# Patient Record
Sex: Female | Born: 1995 | Race: Black or African American | Hispanic: No | Marital: Single | Smoking: Never smoker
Health system: Southern US, Community
[De-identification: ages and names within clinical notes are randomized; demographics above are authoritative.]

## PROBLEM LIST (undated history)

## (undated) DIAGNOSIS — F988 Other specified behavioral and emotional disorders with onset usually occurring in childhood and adolescence: Secondary | ICD-10-CM

## (undated) DIAGNOSIS — F71 Moderate intellectual disabilities: Secondary | ICD-10-CM

## (undated) DIAGNOSIS — G2401 Drug induced subacute dyskinesia: Secondary | ICD-10-CM

## (undated) DIAGNOSIS — F259 Schizoaffective disorder, unspecified: Secondary | ICD-10-CM

## (undated) DIAGNOSIS — F99 Mental disorder, not otherwise specified: Secondary | ICD-10-CM

## (undated) DIAGNOSIS — F6381 Intermittent explosive disorder: Secondary | ICD-10-CM

## (undated) DIAGNOSIS — F29 Unspecified psychosis not due to a substance or known physiological condition: Secondary | ICD-10-CM

## (undated) DIAGNOSIS — F909 Attention-deficit hyperactivity disorder, unspecified type: Secondary | ICD-10-CM

## (undated) DIAGNOSIS — F429 Obsessive-compulsive disorder, unspecified: Secondary | ICD-10-CM

## (undated) DIAGNOSIS — F319 Bipolar disorder, unspecified: Secondary | ICD-10-CM

## (undated) HISTORY — DX: Intermittent explosive disorder: F63.81

## (undated) HISTORY — DX: Schizoaffective disorder, unspecified: F25.9

## (undated) HISTORY — DX: Bipolar disorder, unspecified: F31.9

## (undated) HISTORY — DX: Moderate intellectual disabilities: F71

## (undated) HISTORY — DX: Attention-deficit hyperactivity disorder, unspecified type: F90.9

---
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
## 2013-03-07 ENCOUNTER — Encounter (HOSPITAL_COMMUNITY): Payer: Self-pay

## 2013-03-07 ENCOUNTER — Emergency Department (HOSPITAL_COMMUNITY)
Admission: EM | Admit: 2013-03-07 | Discharge: 2013-03-07 | Disposition: A | Discharge status: Home / Self Care | Payer: 59 | Attending: Emergency Medicine | Admitting: Emergency Medicine

## 2013-03-07 DIAGNOSIS — Z8659 Personal history of other mental and behavioral disorders: Secondary | ICD-10-CM | POA: Insufficient documentation

## 2013-03-07 DIAGNOSIS — F919 Conduct disorder, unspecified: Secondary | ICD-10-CM | POA: Insufficient documentation

## 2013-03-07 DIAGNOSIS — Z79899 Other long term (current) drug therapy: Secondary | ICD-10-CM | POA: Insufficient documentation

## 2013-03-07 DIAGNOSIS — F29 Unspecified psychosis not due to a substance or known physiological condition: Secondary | ICD-10-CM | POA: Insufficient documentation

## 2013-03-07 DIAGNOSIS — IMO0002 Reserved for concepts with insufficient information to code with codable children: Secondary | ICD-10-CM | POA: Insufficient documentation

## 2013-03-07 LAB — BASIC METABOLIC PANEL
Calcium: 10 mg/dL (ref 8.4–10.5)
Creatinine, Ser: 0.7 mg/dL (ref 0.50–1.10)
GFR calc Af Amer: >90 mL/min (ref >90)
GFR calc non Af Amer: >90 mL/min (ref >90)
Sodium: 143 mEq/L (ref 135–145)

## 2013-03-07 LAB — CBC WITH DIFFERENTIAL/PLATELET
Basophils Absolute: 0 10*3/uL (ref 0.0–0.1)
Basophils Relative: 0 % (ref 0–1)
Eosinophils Relative: 1 % (ref 0–5)
HCT: 42.3 % (ref 36.0–46.0)
Lymphocytes Relative: 27 % (ref 12–46)
MCHC: 32.4 g/dL (ref 30.0–36.0)
MCV: 94.8 fL (ref 78.0–100.0)
Monocytes Absolute: 0.2 10*3/uL (ref 0.1–1.0)
Platelets: 178 10*3/uL (ref 150–400)
RDW: 12.6 % (ref 11.5–15.5)
WBC: 6.9 10*3/uL (ref 4.0–10.5)

## 2013-03-07 LAB — RAPID URINE DRUG SCREEN, HOSP PERFORMED
Amphetamines: NOT DETECTED
Benzodiazepines: NOT DETECTED
Tetrahydrocannabinol: NOT DETECTED

## 2013-03-07 LAB — ETHANOL: Alcohol, Ethyl (B): <11 mg/dL (ref 0–11)

## 2013-03-07 NOTE — ED Notes (Signed)
Dr Patria Mane on phone with group home
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
## 2013-03-07 NOTE — ED Notes (Signed)
Pt wanded in triage  

## 2013-07-10 ENCOUNTER — Encounter: Payer: Self-pay | Admitting: Obstetrics & Gynecology

## 2013-07-10 ENCOUNTER — Ambulatory Visit (INDEPENDENT_AMBULATORY_CARE_PROVIDER_SITE_OTHER): Payer: Medicaid Other | Admitting: Obstetrics & Gynecology

## 2013-07-10 VITALS — BP 100/60 | Ht 64.0 in | Wt 134.0 lb

## 2013-07-10 DIAGNOSIS — Z01419 Encounter for gynecological examination (general) (routine) without abnormal findings: Secondary | ICD-10-CM

## 2013-07-10 DIAGNOSIS — Z3049 Encounter for surveillance of other contraceptives: Secondary | ICD-10-CM

## 2013-07-10 DIAGNOSIS — F99 Mental disorder, not otherwise specified: Secondary | ICD-10-CM

## 2013-07-10 NOTE — Progress Notes (Signed)
Patient ID: Lindsey Rivers, female   DOB: [birth year], 17 y.o.   MRN: [id number] Subjective:     Lindsey Rivers is a 17 y.o. female here for a routine exam.  Patient's last menstrual period was 07/07/2013. No obstetric history on file. Birth Control Method:  ocp Menstrual Calendar(currently): regular  Current complaints: none.   Current acute medical issues:  Psychiatric disorder, as yet not characterized   Recent Gynecologic History Patient's last menstrual period was 07/07/2013. Last Pap: never,  na Last mammogram: never,    Past Medical History  Diagnosis Date  . ADD (attention deficit disorder)   . Psychosis   . Tardive dyskinesia   . Mental disorder   . OCD (obsessive compulsive disorder)     History reviewed. No pertinent past surgical history.  OB History   Grav Para Term Preterm Abortions TAB SAB Ect Mult Living                  History   Social History  . Marital Status: Single    Spouse Name: N/A    Number of Children: N/A  . Years of Education: N/A   Social History Main Topics  . Smoking status: Never Smoker   . Smokeless tobacco: None  . Alcohol Use: No  . Drug Use: No  . Sexual Activity: No   Other Topics Concern  . None   Social History Narrative  . None    History reviewed. No pertinent family history.   Review of Systems  Review of Systems  Constitutional: Negative for fever, chills, weight loss, malaise/fatigue and diaphoresis.  HENT: Negative for hearing loss, ear pain, nosebleeds, congestion, sore throat, neck pain, tinnitus and ear discharge.   Eyes: Negative for blurred vision, double vision, photophobia, pain, discharge and redness.  Respiratory: Negative for cough, hemoptysis, sputum production, shortness of breath, wheezing and stridor.   Cardiovascular: Negative for chest pain, palpitations, orthopnea, claudication, leg swelling and PND.  Gastrointestinal: negative for abdominal pain. Negative for heartburn, nausea, vomiting,  diarrhea, constipation, blood in stool and melena.  Genitourinary: Negative for dysuria, urgency, frequency, hematuria and flank pain.  Musculoskeletal: Negative for myalgias, back pain, joint pain and falls.  Skin: Negative for itching and rash.  Neurological: Negative for dizziness, tingling, tremors, sensory change, speech change, focal weakness, seizures, loss of consciousness, weakness and headaches.  Endo/Heme/Allergies: Negative for environmental allergies and polydipsia. Does not bruise/bleed easily.  Psychiatric/Behavioral: Negative for depression, suicidal ideas, hallucinations, memory loss and substance abuse. The patient is not nervous/anxious and does not have insomnia.        Objective:    Physical Exam  Vitals reviewed. Constitutional: She is oriented to person, place, and time. She appears well-developed and well-nourished.  HENT:  Head: Normocephalic and atraumatic.        Right Ear: External ear normal.  Left Ear: External ear normal.  Nose: Nose normal.  Mouth/Throat: Oropharynx is clear and moist.  Eyes: Conjunctivae and EOM are normal. Pupils are equal, round, and reactive to light. Right eye exhibits no discharge. Left eye exhibits no discharge. No scleral icterus.  Neck: Normal range of motion. Neck supple. No tracheal deviation present. No thyromegaly present.  Cardiovascular: Normal rate, regular rhythm, normal heart sounds and intact distal pulses.  Exam reveals no gallop and no friction rub.   No murmur heard. Respiratory: Effort normal and breath sounds normal. No respiratory distress. She has no wheezes. She has no rales. She exhibits no tenderness.  GI: Soft.  Bowel sounds are normal. She exhibits no distension and no mass. There is no tenderness. There is no rebound and no guarding.  Genitourinary:  Breasts not done      Vulva is normal without lesions Vagina Cervix  Uterus  Adnexa   Musculoskeletal: Normal range of motion. She exhibits no edema and no  tenderness.  Neurological: She is alert and oriented to person, place, and time. She has normal reflexes. She displays normal reflexes. No cranial nerve deficit. She exhibits normal muscle tone. Coordination normal.  Skin: Skin is warm and dry. No rash noted. No erythema. No pallor.  Psychiatric: She has a normal mood and affect. Her behavior is normal. Judgment and thought content normal.       Assessment:    Healthy female exam.    Plan:    Contraception: OCP (estrogen/progesterone). Follow up in: 1 year.

## 2013-09-18 ENCOUNTER — Ambulatory Visit: Payer: Medicaid Other | Admitting: Occupational Therapy

## 2013-09-25 ENCOUNTER — Ambulatory Visit: Payer: 59 | Admitting: *Deleted

## 2013-09-28 ENCOUNTER — Ambulatory Visit: Payer: 59 | Admitting: *Deleted

## 2013-09-28 ENCOUNTER — Encounter (HOSPITAL_COMMUNITY): Payer: Self-pay | Admitting: Emergency Medicine

## 2013-09-28 ENCOUNTER — Emergency Department (HOSPITAL_COMMUNITY)
Admission: EM | Admit: 2013-09-28 | Discharge: 2013-09-29 | Disposition: A | Discharge status: Home / Self Care | Payer: 59 | Attending: Emergency Medicine | Admitting: Emergency Medicine

## 2013-09-28 ENCOUNTER — Emergency Department (HOSPITAL_COMMUNITY): Payer: 59

## 2013-09-28 DIAGNOSIS — S60219A Contusion of unspecified wrist, initial encounter: Secondary | ICD-10-CM | POA: Insufficient documentation

## 2013-09-28 DIAGNOSIS — IMO0002 Reserved for concepts with insufficient information to code with codable children: Secondary | ICD-10-CM | POA: Insufficient documentation

## 2013-09-28 DIAGNOSIS — F99 Mental disorder, not otherwise specified: Secondary | ICD-10-CM

## 2013-09-28 DIAGNOSIS — Z8669 Personal history of other diseases of the nervous system and sense organs: Secondary | ICD-10-CM | POA: Insufficient documentation

## 2013-09-28 DIAGNOSIS — S0181XA Laceration without foreign body of other part of head, initial encounter: Secondary | ICD-10-CM

## 2013-09-28 DIAGNOSIS — S0180XA Unspecified open wound of other part of head, initial encounter: Secondary | ICD-10-CM | POA: Insufficient documentation

## 2013-09-28 DIAGNOSIS — F911 Conduct disorder, childhood-onset type: Secondary | ICD-10-CM | POA: Insufficient documentation

## 2013-09-28 DIAGNOSIS — R4689 Other symptoms and signs involving appearance and behavior: Secondary | ICD-10-CM

## 2013-09-28 DIAGNOSIS — Z3202 Encounter for pregnancy test, result negative: Secondary | ICD-10-CM | POA: Insufficient documentation

## 2013-09-28 DIAGNOSIS — Z79899 Other long term (current) drug therapy: Secondary | ICD-10-CM | POA: Insufficient documentation

## 2013-09-28 DIAGNOSIS — F988 Other specified behavioral and emotional disorders with onset usually occurring in childhood and adolescence: Secondary | ICD-10-CM | POA: Insufficient documentation

## 2013-09-28 LAB — CBC WITH DIFFERENTIAL/PLATELET
BASOS PCT: 0 % (ref 0–1)
Basophils Absolute: 0 10*3/uL (ref 0.0–0.1)
EOS ABS: 0 10*3/uL (ref 0.0–0.7)
Eosinophils Relative: 0 % (ref 0–5)
HEMATOCRIT: 40.8 % (ref 36.0–46.0)
HEMOGLOBIN: 13.4 g/dL (ref 12.0–15.0)
Lymphocytes Relative: 25 % (ref 12–46)
Lymphs Abs: 2.2 10*3/uL (ref 0.7–4.0)
MCH: 30.6 pg (ref 26.0–34.0)
MCHC: 32.8 g/dL (ref 30.0–36.0)
MCV: 93.2 fL (ref 78.0–100.0)
MONO ABS: 0.4 10*3/uL (ref 0.1–1.0)
MONOS PCT: 5 % (ref 3–12)
Neutro Abs: 6 10*3/uL (ref 1.7–7.7)
Neutrophils Relative %: 70 % (ref 43–77)
Platelets: 216 10*3/uL (ref 150–400)
RBC: 4.38 MIL/uL (ref 3.87–5.11)
RDW: 13 % (ref 11.5–15.5)
WBC: 8.6 10*3/uL (ref 4.0–10.5)

## 2013-09-28 LAB — RAPID URINE DRUG SCREEN, HOSP PERFORMED
Amphetamines: NOT DETECTED
BENZODIAZEPINES: NOT DETECTED
Barbiturates: NOT DETECTED
Cocaine: NOT DETECTED
Opiates: NOT DETECTED
Tetrahydrocannabinol: NOT DETECTED

## 2013-09-28 LAB — URINALYSIS, ROUTINE W REFLEX MICROSCOPIC
Bilirubin Urine: NEGATIVE
Glucose, UA: NEGATIVE mg/dL
Hgb urine dipstick: NEGATIVE
KETONES UR: NEGATIVE mg/dL
LEUKOCYTES UA: NEGATIVE
NITRITE: NEGATIVE
PH: 6 (ref 5.0–8.0)
Protein, ur: NEGATIVE mg/dL
UROBILINOGEN UA: 0.2 mg/dL (ref 0.0–1.0)

## 2013-09-28 LAB — COMPREHENSIVE METABOLIC PANEL
ALBUMIN: 3.7 g/dL (ref 3.5–5.2)
ALT: 10 U/L (ref 0–35)
AST: 17 U/L (ref 0–37)
Alkaline Phosphatase: 45 U/L (ref 39–117)
BILIRUBIN TOTAL: 0.4 mg/dL (ref 0.3–1.2)
BUN: 14 mg/dL (ref 6–23)
CO2: 24 mEq/L (ref 19–32)
CREATININE: 0.7 mg/dL (ref 0.50–1.10)
Calcium: 9.6 mg/dL (ref 8.4–10.5)
Chloride: 104 mEq/L (ref 96–112)
GFR calc non Af Amer: >90 mL/min (ref >90)
Glucose, Bld: 110 mg/dL — ABNORMAL HIGH (ref 70–99)
Potassium: 4 mEq/L (ref 3.7–5.3)
Sodium: 140 mEq/L (ref 137–147)
TOTAL PROTEIN: 7.7 g/dL (ref 6.0–8.3)

## 2013-09-28 LAB — PREGNANCY, URINE: PREG TEST UR: NEGATIVE

## 2013-09-28 LAB — ETHANOL

## 2013-09-28 MED ORDER — ACETAMINOPHEN 325 MG PO TABS: 650.0000 mg | ORAL_TABLET | ORAL | Status: DC | PRN

## 2013-09-28 MED ORDER — ALUM & MAG HYDROXIDE-SIMETH 200-200-20 MG/5ML PO SUSP: 30.0000 mL | ORAL | Status: DC | PRN

## 2013-09-28 MED ORDER — LIDOCAINE-EPINEPHRINE-TETRACAINE (LET) TOPICAL GEL: 3.0000 mL | Freq: Once | TOPICAL | Status: DC

## 2013-09-28 MED ORDER — LORAZEPAM 2 MG/ML IJ SOLN
1.0000 mg | Freq: Once | INTRAMUSCULAR | Status: AC
  Administered 2013-09-28: 18:00:00 via INTRAMUSCULAR

## 2013-09-28 MED ORDER — HALOPERIDOL 2 MG PO TABS
2.0000 mg | ORAL_TABLET | ORAL | Status: AC
  Administered 2013-09-28: 2 mg via ORAL
  Filled 2013-09-28: qty 1

## 2013-09-28 MED ORDER — ONDANSETRON HCL 4 MG PO TABS: 4.0000 mg | ORAL_TABLET | Freq: Three times a day (TID) | ORAL | Status: DC | PRN

## 2013-09-28 MED ORDER — HALOPERIDOL 2 MG PO TABS
ORAL_TABLET | ORAL | Status: AC
  Filled 2013-09-28: qty 1

## 2013-09-28 MED ORDER — HALOPERIDOL 2 MG PO TABS
2.0000 mg | ORAL_TABLET | Freq: Two times a day (BID) | ORAL | Status: DC
  Administered 2013-09-29: 2 mg via ORAL
  Filled 2013-09-28 (×8): qty 1

## 2013-09-28 MED ORDER — QUETIAPINE FUMARATE ER 50 MG PO TB24
150.0000 mg | ORAL_TABLET | ORAL | Status: DC
  Administered 2013-09-28 – 2013-09-29 (×2): 150 mg via ORAL
  Filled 2013-09-28: qty 3

## 2013-09-28 MED ORDER — LIDOCAINE-EPINEPHRINE-TETRACAINE (LET) SOLUTION
3.0000 mL | Freq: Once | NASAL | Status: AC
  Administered 2013-09-28: 3 mL via TOPICAL
  Filled 2013-09-28: qty 3

## 2013-09-28 MED ORDER — IBUPROFEN 400 MG PO TABS
600.0000 mg | ORAL_TABLET | Freq: Three times a day (TID) | ORAL | Status: DC | PRN
Start: 2013-09-28 — End: 2013-09-29

## 2013-09-28 MED ORDER — LEVONORGESTREL-ETHINYL ESTRAD 0.15-30 MG-MCG PO TABS
1.0000 | ORAL_TABLET | Freq: Every day | ORAL | Status: DC
  Administered 2013-09-29: 1 via ORAL

## 2013-09-28 MED ORDER — LORAZEPAM 2 MG/ML IJ SOLN
INTRAMUSCULAR | Status: AC
  Filled 2013-09-28: qty 1

## 2013-09-28 MED ORDER — TRAZODONE HCL 50 MG PO TABS
300.0000 mg | ORAL_TABLET | Freq: Every day | ORAL | Status: DC
  Filled 2013-09-28: qty 6
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
## 2013-09-28 NOTE — ED Notes (Signed)
LET applied to forehead.
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
## 2013-09-28 NOTE — Progress Notes (Signed)
MHT initiated bed placement at the following facilities that have beds available for I/DD:  1)[facility]-faxed referral 2)[facility]-faxed referral 3)[facility]-no answer  Donnella ShamMichelle L Fannye Myer,MHT/NS

## 2013-09-28 NOTE — ED Notes (Signed)
Med Tech at group home  Lindsey Rivers [phone]  Group home leader Esmeralda LinksCharlotte Rivers [phone]  They are leaving now and may be reached on the phone

## 2013-09-28 NOTE — ED Notes (Signed)
[facility] and [facility]P

## 2013-09-28 NOTE — ED Notes (Signed)
Called Lindsey GowerCharlotte to bring glasses and birthcontrol. Pt can not see TV, hope glass will help to give pt an activity.

## 2013-09-28 NOTE — ED Notes (Signed)
Called [facility] Med tech, states pt was tapered off Risperdal and started on  Seroquel first week in FEB.

## 2013-09-28 NOTE — ED Notes (Signed)
Pt is screaming, upset, crying, does not want to stay here. Wants to go back to group home. Officer at the bedside.

## 2013-09-28 NOTE — BH Assessment (Signed)
[facility] Assessment Progress Note  At 15:57 I spoke to EDP Devoria AlbeIva Knapp, MD in anticipation of TTS assessment scheduled for 16:15.  Doylene Canninghomas Shammara Jarrett, MA Triage Specialist 09/28/2013 @ 18:54

## 2013-09-28 NOTE — ED Notes (Signed)
Med tech Carlena Saxmma Crews [phone]
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
## 2013-09-28 NOTE — ED Notes (Signed)
Patient refusing to take medications, stating she doesn't like the way they make her feel.  Resistant to answering questions. Acting as if she is asleep at times.  Informed her that as long as she doesn't act out she doesn't have to get the medications, but if she acts out, I may have to give her a shot.  Patient does not acknowledge this information.
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
## 2013-09-28 NOTE — ED Notes (Signed)
[facility] staff at [facility]. States pt did not want to go because she has a boyfriend a school and she wanted to go to school

## 2013-09-28 NOTE — ED Notes (Signed)
telepsy complete 
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
## 2013-09-28 NOTE — ED Notes (Signed)
Group home wants her commited for readjusting medications.  Pt has been speaking non stop since she got here, she continues to be agitated and handcuffed.
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
## 2013-09-28 NOTE — ED Notes (Signed)
Pt states she took birthcontrol today in home

## 2013-09-29 DIAGNOSIS — F603 Borderline personality disorder: Secondary | ICD-10-CM

## 2013-09-29 DIAGNOSIS — F489 Nonpsychotic mental disorder, unspecified: Secondary | ICD-10-CM

## 2013-09-29 NOTE — ED Notes (Signed)
No communication yet with [facility]. [facility] someone bring [facility] home

## 2013-09-29 NOTE — ED Notes (Signed)
Pt sitting in bed stating she is ready to go home. Pt states we can't keep her caged up in this hospital and take her clothes from her. Pt states if we try to place her anywhere other than [facility] with her friends, she is "gonna pitch a fit".  RN stated to patient that we are following protocol by keeping her inside the hospital and in paper scrubs and that she would have breakfast soon and be updated about placement when information is received. [employer] at bedside.

## 2013-09-29 NOTE — BH Assessment (Signed)
[facility] Dr. Willow OraPeter Ganpat declined Pt due to her acuity.  Harlin RainFord Ellis Ria CommentWarrick Jr, LPC, [facility] Triage Specialist

## 2013-09-29 NOTE — BH Assessment (Signed)
Pt was seen by Nanine MeansJamison Lord NP, Catha NottinghamJamison recommended patient be discharged back to group home and follow up with current provider. Dr. Ottie Glazierusal agreed with Nanine MeansJamison Lord NP recommendation, Dois DavenportSandra RN patient's nurse made aware of recommendation.

## 2013-09-29 NOTE — Discharge Instructions (Signed)
Follow up with your doctor as instructed by behavior healthTissue Adhesive Wound Care Some cuts, wounds, lacerations, and incisions can be repaired by using tissue adhesive. Tissue adhesive is like glue. It holds the skin together, allowing for faster healing. It forms a strong bond on the skin in about 1 minute and reaches its full strength in about 2 or 3 minutes. The adhesive disappears naturally while the wound is healing. It is important to take proper care of your wound at home while it heals.  HOME CARE INSTRUCTIONS   Showers are allowed. Do not soak the area containing the tissue adhesive. Do not take baths, swim, or use hot tubs. Do not use any soaps or ointments on the wound. Certain ointments can weaken the glue.  If a bandage (dressing) has been applied, follow your health care provider's instructions for how often to change the dressing.   Keep the dressing dry if one has been applied.   Do not scratch, pick, or rub the adhesive.   Do not place tape over the adhesive. The adhesive could come off when pulling the tape off.   Protect the wound from further injury until it is healed.   Protect the wound from sun and tanning bed exposure while it is healing and for several weeks after healing.   Only take over-the-counter or prescription medicines as directed by your health care provider.   Keep all follow-up appointments as directed by your health care provider. SEEK IMMEDIATE MEDICAL CARE IF:   Your wound becomes red, swollen, hot, or tender.   You develop a rash after the glue is applied.  You have increasing pain in the wound.   You have a red streak that goes away from the wound.   You have pus coming from the wound.   You have increased bleeding.  You have a fever.  You have shaking chills.   You notice a bad smell coming from the wound.   Your wound or adhesive breaks open.  MAKE SURE YOU:   Understand these instructions.  Will watch your  condition.  Will get help right away if you are not doing well or get worse. Document Released: 01/12/2001 Document Revised: 05/09/2013 Document Reviewed: 02/07/2013 [facility] Patient Information 2014 [employer] [place].

## 2013-09-29 NOTE — Progress Notes (Signed)
1326 Pt declined by Abran CantorFrye d/t being combative, considered exclusionary criteria.   Tomi BambergerMariya Addelynn Batte Disposition MHT

## 2013-09-29 NOTE — ED Notes (Signed)
Nurse called [phone] to follow up. Message left for Kara Meadmma to make sure she got in touch with her administrator so pt could return home

## 2013-09-29 NOTE — ED Notes (Signed)
Pt's birth control pills returned with pt with law enforcement custody and d/c instructions

## 2013-09-29 NOTE — ED Notes (Signed)
Patient has slept majority of night, only waking once to go to BR.  She has been calm and cooperative w/out any periods of aggression.

## 2013-09-29 NOTE — Progress Notes (Addendum)
0921 Placed call to lead staff member at the group home Lindsey Rivers regarding whether or not the facility would be willing to accept pt back.  Stated that she would have to discuss the situation with the administrator Lindsey Mule([name]) and would give her the number to TTS and give us information on whether or not pt is allowed back.  Lindsey Rivers also stated that their facility does not have the staff on the weekends to take pt back today so if pt was allowed back it would be Monday 3.2.15.  1243 Placed follow-up call to Ms. Lindsey LinksCharlotte Rivers [phone] regarding pt and speaking with administrator, did not get an answer but left voice mail.  Lindsey Rivers Disposition MHT

## 2013-09-29 NOTE — ED Notes (Signed)
[facility] called to come bring pt home. Kara MeadEmma, stated the administrator was no there. She would not give phone number to nurse calling. Stated she would get in touch with the administrator and have her call us
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
## 2013-10-19 ENCOUNTER — Ambulatory Visit: Payer: Medicaid Other | Attending: Internal Medicine | Admitting: Occupational Therapy

## 2013-10-19 DIAGNOSIS — F79 Unspecified intellectual disabilities: Secondary | ICD-10-CM | POA: Insufficient documentation

## 2013-11-19 ENCOUNTER — Encounter: Payer: 59 | Admitting: Occupational Therapy

## 2015-02-27 ENCOUNTER — Other Ambulatory Visit (HOSPITAL_COMMUNITY)
Admission: RE | Admit: 2015-02-27 | Discharge: 2015-02-27 | Disposition: A | Discharge status: Home / Self Care | Payer: Medicaid Other | Source: Ambulatory Visit | Attending: Obstetrics and Gynecology | Admitting: Obstetrics and Gynecology

## 2015-02-27 ENCOUNTER — Encounter: Payer: Self-pay | Admitting: Adult Health

## 2015-02-27 ENCOUNTER — Ambulatory Visit (INDEPENDENT_AMBULATORY_CARE_PROVIDER_SITE_OTHER): Payer: Medicaid Other | Admitting: Adult Health

## 2015-02-27 VITALS — BP 120/76 | HR 60 | Ht 63.0 in | Wt 150.0 lb

## 2015-02-27 DIAGNOSIS — Z01419 Encounter for gynecological examination (general) (routine) without abnormal findings: Secondary | ICD-10-CM | POA: Diagnosis not present

## 2015-02-27 DIAGNOSIS — F99 Mental disorder, not otherwise specified: Secondary | ICD-10-CM

## 2015-02-27 DIAGNOSIS — Z7689 Persons encountering health services in other specified circumstances: Secondary | ICD-10-CM

## 2015-02-27 MED ORDER — LEVONORGESTREL-ETHINYL ESTRAD 0.15-30 MG-MCG PO TABS: 1.0000 | ORAL_TABLET | Freq: Every day | ORAL | Status: DC

## 2015-02-27 NOTE — Patient Instructions (Signed)
Physical in  1year Pap in 2 years if normal Take portia daily

## 2015-02-27 NOTE — Progress Notes (Signed)
Patient ID: Lindsey Rivers, female   DOB: [birth year], 19 y.o.   MRN: [id number] History of Present Illness: Lindsey Rivers is a 19 year old black female, who resides at [facility], she is in for her first pap and physical.She has her care giver with her.She is cooperative, and child like. PCP is Dr Marilu Favre.  Current Medications, Allergies, Past Medical History, Past Surgical History, Family History and Social History were reviewed in [employer] record.     Review of Systems: Patient denies any headaches, hearing loss, fatigue, blurred vision, shortness of breath, chest pain, abdominal pain, problems with bowel movements, urination, or intercourse(Not having sex). No joint pain or mood swings.    Physical Exam:BP 120/76 mmHg  Pulse 60  Ht  (1.6 m)  Wt 150 lb (68.04 kg)  BMI 26.58 kg/m2 General:  Well developed, well nourished, no acute distress Skin:  Warm and dry Neck:  Midline trachea, normal thyroid, good ROM, no lymphadenopathy Lungs; Clear to auscultation bilaterally Breast:  No dominant palpable mass, retraction, or nipple discharge Cardiovascular: Regular rate and rhythm Abdomen:  Soft, non tender, no hepatosplenomegaly Pelvic:  External genitalia is normal in appearance, no lesions.  The vagina is normal in appearance.Hymen intact. Urethra has no lesions or masses. The cervix is tiny, pap performed.  Uterus is felt to be normal size, shape, and contour.  No adnexal masses or tenderness noted.Bladder is non tender, no masses felt. Extremities/musculoskeletal:  No swelling or varicosities noted, no clubbing or cyanosis Psych:  No mood changes, alert and cooperative,seems happy   Impression: Well woman gyn exam with pap Period management Psychiatric disturbance    Plan:  Refilled portia x 1 year Physical in 1 year Pap in 2 years if normal

## 2015-03-04 LAB — CYTOLOGY - PAP
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
## 2015-06-19 ENCOUNTER — Emergency Department (HOSPITAL_COMMUNITY)
Admission: EM | Admit: 2015-06-19 | Discharge: 2015-06-19 | Disposition: A | Discharge status: Home / Self Care | Payer: MEDICAID | Attending: Emergency Medicine | Admitting: Emergency Medicine

## 2015-06-19 ENCOUNTER — Emergency Department (HOSPITAL_COMMUNITY): Payer: MEDICAID

## 2015-06-19 ENCOUNTER — Encounter (HOSPITAL_COMMUNITY): Payer: Self-pay | Admitting: Emergency Medicine

## 2015-06-19 DIAGNOSIS — S0012XA Contusion of left eyelid and periocular area, initial encounter: Secondary | ICD-10-CM | POA: Insufficient documentation

## 2015-06-19 DIAGNOSIS — Y9289 Other specified places as the place of occurrence of the external cause: Secondary | ICD-10-CM | POA: Insufficient documentation

## 2015-06-19 DIAGNOSIS — Z008 Encounter for other general examination: Secondary | ICD-10-CM | POA: Diagnosis present

## 2015-06-19 DIAGNOSIS — S99912A Unspecified injury of left ankle, initial encounter: Secondary | ICD-10-CM | POA: Diagnosis not present

## 2015-06-19 DIAGNOSIS — Z79899 Other long term (current) drug therapy: Secondary | ICD-10-CM | POA: Diagnosis not present

## 2015-06-19 DIAGNOSIS — Z8781 Personal history of (healed) traumatic fracture: Secondary | ICD-10-CM | POA: Diagnosis not present

## 2015-06-19 DIAGNOSIS — Z7951 Long term (current) use of inhaled steroids: Secondary | ICD-10-CM | POA: Insufficient documentation

## 2015-06-19 DIAGNOSIS — Z793 Long term (current) use of hormonal contraceptives: Secondary | ICD-10-CM | POA: Diagnosis not present

## 2015-06-19 DIAGNOSIS — Z792 Long term (current) use of antibiotics: Secondary | ICD-10-CM | POA: Diagnosis not present

## 2015-06-19 DIAGNOSIS — Y998 Other external cause status: Secondary | ICD-10-CM | POA: Diagnosis not present

## 2015-06-19 DIAGNOSIS — F911 Conduct disorder, childhood-onset type: Secondary | ICD-10-CM | POA: Diagnosis not present

## 2015-06-19 DIAGNOSIS — Y9389 Activity, other specified: Secondary | ICD-10-CM | POA: Diagnosis not present

## 2015-06-19 DIAGNOSIS — Z7952 Long term (current) use of systemic steroids: Secondary | ICD-10-CM | POA: Diagnosis not present

## 2015-06-19 DIAGNOSIS — R4689 Other symptoms and signs involving appearance and behavior: Secondary | ICD-10-CM

## 2015-06-19 MED ORDER — CLONIDINE HCL 0.1 MG PO TABS: 0.1000 mg | ORAL_TABLET | Freq: Two times a day (BID) | ORAL | Status: DC

## 2015-06-19 MED ORDER — CLINDAMYCIN PHOS-BENZOYL PEROX 1-5 % EX GEL: 1.0000 "application " | Freq: Two times a day (BID) | CUTANEOUS | Status: DC

## 2015-06-19 MED ORDER — BREXPIPRAZOLE 0.5 MG PO TABS: 2.0000 | ORAL_TABLET | Freq: Every day | ORAL | Status: DC

## 2015-06-19 MED ORDER — DIVALPROEX SODIUM 250 MG PO DR TAB: 250.0000 mg | DELAYED_RELEASE_TABLET | Freq: Two times a day (BID) | ORAL | Status: DC

## 2015-06-19 MED ORDER — TRAZODONE HCL 50 MG PO TABS: 300.0000 mg | ORAL_TABLET | Freq: Every day | ORAL | Status: DC

## 2015-06-19 MED ORDER — ACETAMINOPHEN 325 MG PO TABS
650.0000 mg | ORAL_TABLET | Freq: Once | ORAL | Status: AC
  Administered 2015-06-19: 650 mg via ORAL

## 2015-06-19 MED ORDER — ACETAMINOPHEN 325 MG PO TABS
ORAL_TABLET | ORAL | Status: AC
  Administered 2015-06-19: 650 mg via ORAL
  Filled 2015-06-19: qty 2

## 2015-06-19 MED ORDER — LEVONORGESTREL-ETHINYL ESTRAD 0.15-30 MG-MCG PO TABS: 1.0000 | ORAL_TABLET | Freq: Every day | ORAL | Status: DC

## 2015-06-19 MED ORDER — FLUTICASONE PROPIONATE 50 MCG/ACT NA SUSP
2.0000 | Freq: Every day | NASAL | Status: DC
Start: 2015-06-20 — End: 2015-06-19
  Filled 2015-06-19: qty 16

## 2015-06-19 MED ORDER — DIAZEPAM 2 MG PO TABS: 2.0000 mg | ORAL_TABLET | Freq: Three times a day (TID) | ORAL | Status: DC
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
## 2015-06-19 NOTE — Discharge Instructions (Signed)
Follow up with out pt tx °

## 2015-06-19 NOTE — ED Notes (Signed)
telepsych in progress 
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
## 2015-06-19 NOTE — ED Notes (Signed)
Attempted to leave message at Group Home but mailbox full.

## 2015-06-19 NOTE — ED Notes (Signed)
Meal tray given 

## 2015-06-19 NOTE — ED Notes (Signed)
[facility]. Patient to be DC'd back to group home.

## 2015-06-19 NOTE — BH Assessment (Addendum)
Tele Assessment Note   Lindsey Rivers is an 19 y.o. female. Pt denies SI/HI. Pt denies AVH. It was reported that the Pt was aggressive at her group and banging her head against the wall. The group home staff contacted the Pt's psychologist and he recommended that the Pt be transported to [facility]. Pt states she is angry because she desires to live with her mother. Pt states "I hate my group home." Pt states she has a guardian but could not provided a name and number of her guardian. Pt denies SA. Pt denies abuse. Pt reports multiple group home placements. Pt states that the group home staff are mean to her because they will not allow her to live with her mother. Pt states that she is currently in a group home because of her behavior. Pt has a psychologist but could not provide their information. Pt is prescribed medication but could not provide her medications.  Writer consulted with May, NP. Per May, NP Pt does not meet inpatient criteria. Pt should be observed and D/C back to group home.   Writer left a message at the client's group.   Diagnosis:  F90.0 ADHD Developmental Delay  Past Medical History:  Past Medical History  Diagnosis Date  . ADD (attention deficit disorder)   . Psychosis   . Tardive dyskinesia   . Mental disorder   . OCD (obsessive compulsive disorder)     History reviewed. No pertinent past surgical history.  Family History: No family history on file.  Social History:  reports that she has never smoked. She has never used smokeless tobacco. She reports that she does not drink alcohol or use illicit drugs.  Additional Social History:     CIWA: CIWA-Ar BP: 121/88 mmHg Pulse Rate: 88 COWS:    PATIENT STRENGTHS: (choose at least two) Communication skills Supportive family/friends  Allergies:  Allergies  Allergen Reactions  . Other     Seasonal allergies    Home Medications:  (Not in a hospital admission)  OB/GYN Status:  No LMP recorded. Patient is not  currently having periods (Reason: Oral contraceptives).  General Assessment Data Location of Assessment: [facility] TTS Assessment: In system Is this a Tele or Face-to-Face Assessment?: Face-to-Face Is this an Initial Assessment or a Re-assessment for this encounter?: Initial Assessment Marital status: Single Maiden name: NA Is patient pregnant?: No Pregnancy Status: No Living Arrangements: Group Home Can pt return to current living arrangement?: Yes Admission Status: Voluntary Is patient capable of signing voluntary admission?: Yes Referral Source: Self/Family/Friend Insurance type: Medicaid     Crisis Care Plan Living Arrangements: Group Home Name of Psychiatrist: Unknown Name of Therapist: Unknown  Education Status Is patient currently in school?: No Current Grade: NA Highest grade of school patient has completed: Unknown Name of school: NA Contact person: NA  Risk to self with the past 6 months Suicidal Ideation: No Has patient been a risk to self within the past 6 months prior to admission? : No Suicidal Intent: No Has patient had any suicidal intent within the past 6 months prior to admission? : No Is patient at risk for suicide?: No Suicidal Plan?: No Has patient had any suicidal plan within the past 6 months prior to admission? : No Access to Means: No What has been your use of drugs/alcohol within the last 12 months?: NA Previous Attempts/Gestures: No How many times?: 0 Other Self Harm Risks: NA Triggers for Past Attempts: None known Intentional Self Injurious Behavior: None Family Suicide  History: Unknown Recent stressful life event(s): Other (Comment) (does not like her group home) Persecutory voices/beliefs?: No Depression: Yes Depression Symptoms: Loss of interest in usual pleasures, Feeling angry/irritable, Feeling worthless/self pity Substance abuse history and/or treatment for substance abuse?: No Suicide prevention information given to non-admitted  patients: Not applicable  Risk to Others within the past 6 months Homicidal Ideation: No Does patient have any lifetime risk of violence toward others beyond the six months prior to admission? : No Thoughts of Harm to Others: No Current Homicidal Intent: No Current Homicidal Plan: No Access to Homicidal Means: No Identified Victim: NA History of harm to others?: No Assessment of Violence: None Noted Violent Behavior Description: NA Does patient have access to weapons?: No Criminal Charges Pending?: No Does patient have a court date: No Is patient on probation?: No  Psychosis Hallucinations: None noted Delusions: None noted  Mental Status Report Appearance/Hygiene: Unremarkable Eye Contact: Fair Motor Activity: Freedom of movement Speech: Tangential Level of Consciousness: Alert Mood: Anxious Affect: Anxious Anxiety Level: Moderate Thought Processes: Tangential Judgement: Impaired Orientation: Place, Situation Obsessive Compulsive Thoughts/Behaviors: None  Cognitive Functioning Concentration: Normal Memory: Recent Intact, Remote Intact IQ: Below Average Level of Function: unknown Insight: Poor Impulse Control: Poor Appetite: Fair Weight Loss: 0 Weight Gain: 0 Sleep: Decreased Total Hours of Sleep: 5 Vegetative Symptoms: None  ADLScreening [facility] Assessment Services) Patient's cognitive ability adequate to safely complete daily activities?: No Patient able to express need for assistance with ADLs?: Yes Independently performs ADLs?: Yes (appropriate for developmental age)  Prior Inpatient Therapy Prior Inpatient Therapy: No Prior Therapy Dates: NA Prior Therapy Facilty/Provider(s): NA Reason for Treatment: NA  Prior Outpatient Therapy Prior Outpatient Therapy: Yes Prior Therapy Dates: 2016 Prior Therapy Facilty/Provider(s): Unknown Reason for Treatment: Unknown Does patient have an ACCT team?: No Does patient have Intensive In-House Services?  : No Does  patient have [facility] services? : No Does patient have P4CC services?: Yes  ADL Screening (condition at time of admission) Patient's cognitive ability adequate to safely complete daily activities?: No Patient able to express need for assistance with ADLs?: Yes Independently performs ADLs?: Yes (appropriate for developmental age)             Advance Directives (For Healthcare) Does patient have an advance directive?: No    Additional Information 1:1 In Past 12 Months?: No CIRT Risk: No Elopement Risk: No Does patient have medical clearance?: Yes     Disposition:  Disposition Initial Assessment Completed for this Encounter: Yes  Caycee Wanat D 06/19/2015 1:30 PM

## 2015-06-19 NOTE — ED Notes (Signed)
Patient belongings secured in ED locker. 1 pair of tennis shoes, 1 pair of jeans, socks, t-shirt, and cotton jacket. Patient advised of belongings policy.

## 2015-06-19 NOTE — Progress Notes (Addendum)
Per NP May Augustin, patient doesn't meet inpatient criteria and to be d/c to group home.  This Clinical research associatewriter attempted to locate patient's legal guardian (pt's grandmother).  Writer spoke with RN Tiffany and left voicemail for med tech Carlena Saxmma Crews [phone], inquiring about contact information for pt's legal guardian but not successful.  Writer informed Ma RingsHarold Carelock [phone]) at Group Home that patient doesn't meet inpatient criteria, that patient is stable, and that pt is now ready for discharge. Jake SharkHarold stated that he will come to the ED to complete their own assessment to make sure that patient is stable enough to return to group home. Jake SharkHarold requested that [facility] RN provides him with a list of the medications that have been administered to patient while in the ED. RN Arlys JohnBrian advised of Harold's request and time of visit - 6pm on 11/17 to complete assessment.  Med Tech at group home    Carlena Saxmma Crews [phone]    Group home leader  Esmeralda LinksCharlotte Garrison  [phone]  CSW will continue to follow up with patient's d/c.  Mel[place] Abtsatia Antaeus Karel, LCSWA Disposition staff 06/19/2015 5:26 PM

## 2015-06-19 NOTE — ED Notes (Addendum)
Ma RingsHarold Rivers [phone], representative from [facility]. [facility] given that information.

## 2015-11-18 ENCOUNTER — Emergency Department (HOSPITAL_COMMUNITY)
Admission: EM | Admit: 2015-11-18 | Discharge: 2015-11-18 | Disposition: A | Discharge status: Home / Self Care | Payer: Medicaid Other | Attending: Emergency Medicine | Admitting: Emergency Medicine

## 2015-11-18 ENCOUNTER — Emergency Department (HOSPITAL_COMMUNITY): Payer: Medicaid Other

## 2015-11-18 ENCOUNTER — Encounter (HOSPITAL_COMMUNITY): Payer: Self-pay | Admitting: *Deleted

## 2015-11-18 DIAGNOSIS — H9201 Otalgia, right ear: Secondary | ICD-10-CM | POA: Insufficient documentation

## 2015-11-18 DIAGNOSIS — J189 Pneumonia, unspecified organism: Secondary | ICD-10-CM | POA: Diagnosis not present

## 2015-11-18 DIAGNOSIS — Z79899 Other long term (current) drug therapy: Secondary | ICD-10-CM | POA: Insufficient documentation

## 2015-11-18 DIAGNOSIS — R05 Cough: Secondary | ICD-10-CM | POA: Diagnosis present

## 2015-11-18 MED ORDER — DOXYCYCLINE HYCLATE 100 MG PO CAPS: 100.0000 mg | ORAL_CAPSULE | Freq: Two times a day (BID) | ORAL | Status: DC

## 2015-11-18 MED ORDER — BENZONATATE 100 MG PO CAPS
200.0000 mg | ORAL_CAPSULE | Freq: Once | ORAL | Status: AC
  Administered 2015-11-18: 200 mg via ORAL
  Filled 2015-11-18: qty 2

## 2015-11-18 MED ORDER — AZITHROMYCIN 250 MG PO TABS
500.0000 mg | ORAL_TABLET | Freq: Once | ORAL | Status: AC
  Administered 2015-11-18: 500 mg via ORAL
  Filled 2015-11-18: qty 2

## 2015-11-18 MED ORDER — ALBUTEROL SULFATE HFA 108 (90 BASE) MCG/ACT IN AERS: 1.0000 | INHALATION_SPRAY | Freq: Four times a day (QID) | RESPIRATORY_TRACT | Status: AC | PRN

## 2015-11-18 MED ORDER — BENZONATATE 100 MG PO CAPS: 200.0000 mg | ORAL_CAPSULE | Freq: Three times a day (TID) | ORAL | Status: AC | PRN

## 2015-11-18 NOTE — Discharge Instructions (Signed)
Community-Acquired Pneumonia, Adult °Pneumonia is an infection of the lungs. There are different types of pneumonia. One type can develop while a person is in a hospital. A different type, called community-acquired pneumonia, develops in people who are not, or have not recently been, in the hospital or other health care facility.  °CAUSES °Pneumonia may be caused by bacteria, viruses, or funguses. Community-acquired pneumonia is often caused by Streptococcus pneumonia bacteria. These bacteria are often passed from one person to another by breathing in droplets from the cough or sneeze of an infected person. °RISK FACTORS °The condition is more likely to develop in: °· People who have chronic diseases, such as chronic obstructive pulmonary disease (COPD), asthma, congestive heart failure, cystic fibrosis, diabetes, or kidney disease. °· People who have early-stage or late-stage HIV. °· People who have sickle cell disease. °· People who have had their spleen removed (splenectomy). °· People who have poor dental hygiene. °· People who have medical conditions that increase the risk of breathing in (aspirating) secretions their own mouth and nose.   °· People who have a weakened immune system (immunocompromised). °· People who smoke. °· People who travel to areas where pneumonia-causing germs commonly exist. °· People who are around animal habitats or animals that have pneumonia-causing germs, including birds, bats, rabbits, cats, and farm animals. °SYMPTOMS °Symptoms of this condition include: °· A dry cough. °· A wet (productive) cough. °· Fever. °· Sweating. °· Chest pain, especially when breathing deeply or coughing. °· Rapid breathing or difficulty breathing. °· Shortness of breath. °· Shaking chills. °· Fatigue. °· Muscle aches. °DIAGNOSIS °Your health care provider will take a medical history and perform a physical exam. You may also have other tests, including: °· Imaging studies of your chest, including  X-rays. °· Tests to check your blood oxygen level and other blood gases. °· Other tests on blood, mucus (sputum), fluid around your lungs (pleural fluid), and urine. °If your pneumonia is severe, other tests may be done to identify the specific cause of your illness. °TREATMENT °The type of treatment that you receive depends on many factors, such as the cause of your pneumonia, the medicines you take, and other medical conditions that you have. For most adults, treatment and recovery from pneumonia may occur at home. In some cases, treatment must happen in a hospital. Treatment may include: °· Antibiotic medicines, if the pneumonia was caused by bacteria. °· Antiviral medicines, if the pneumonia was caused by a virus. °· Medicines that are given by mouth or through an IV tube. °· Oxygen. °· Respiratory therapy. °Although rare, treating severe pneumonia may include: °· Mechanical ventilation. This is done if you are not breathing well on your own and you cannot maintain a safe blood oxygen level. °· Thoracentesis. This procedure removes fluid around one lung or both lungs to help you breathe better. °HOME CARE INSTRUCTIONS °· Take over-the-counter and prescription medicines only as told by your health care provider. °¨ Only take cough medicine if you are losing sleep. Understand that cough medicine can prevent your body's natural ability to remove mucus from your lungs. °¨ If you were prescribed an antibiotic medicine, take it as told by your health care provider. Do not stop taking the antibiotic even if you start to feel better. °· Sleep in a semi-upright position at night. Try sleeping in a reclining chair, or place a few pillows under your head. °· Do not use tobacco products, including cigarettes, chewing tobacco, and e-cigarettes. If you need help quitting, ask your health care provider. °· Drink enough water to keep your urine   clear or pale yellow. This will help to thin out mucus secretions in your  lungs. PREVENTION There are ways that you can decrease your risk of developing community-acquired pneumonia. Consider getting a pneumococcal vaccine if:  You are older than 22 years of age.  You are older than 22 years of age and are undergoing cancer treatment, have chronic lung disease, or have other medical conditions that affect your immune system. Ask your health care provider if this applies to you. There are different types and schedules of pneumococcal vaccines. Ask your health care provider which vaccination option is best for you. You may also prevent community-acquired pneumonia if you take these actions:  Get an influenza vaccine every year. Ask your health care provider which type of influenza vaccine is best for you.  Go to the dentist on a regular basis.  Wash your hands often. Use hand sanitizer if soap and water are not available. SEEK MEDICAL CARE IF:  You have a fever.  You are losing sleep because you cannot control your cough with cough medicine. SEEK IMMEDIATE MEDICAL CARE IF:  You have worsening shortness of breath.  You have increased chest pain.  Your sickness becomes worse, especially if you are an older adult or have a weakened immune system.  You cough up blood.   This information is not intended to replace advice given to you by your health care provider. Make sure you discuss any questions you have with your health care provider.   Document Released: 07/19/2005 Document Revised: 04/09/2015 Document Reviewed: 11/13/2014 Elsevier Interactive Patient Education 2016 [employer]      Use the inhaler as prescribed along with Tessalon which should help you with your cough symptoms.  Rest and make sure you're drinking plenty of fluids.

## 2015-11-18 NOTE — ED Notes (Addendum)
Pt c/o cough. Recently seen at urgent care, given allergy meds. Reports right ear pain also. Patient from [facility].

## 2015-11-19 NOTE — ED Provider Notes (Signed)
CSN: [id number]     Arrival date & time 11/18/15  1807 History   First MD Initiated Contact with Patient 11/18/15 1820     Chief Complaint  Patient presents with  . Cough     (Consider location/radiation/quality/duration/timing/severity/associated sxs/prior Treatment) The history is provided by the patient and a caregiver.   Lindsey Rivers is a 20 y.o. female with medical history outlined below and who lives in a group home,  presents with a one history of uri type symptoms which includes nasal congestion with clear rhinorrhea, sore throat, low grade fever and nonproductive dry sounding cough.  Symptoms do not include shortness of breath, chest pain,  Nausea, vomiting or diarrhea.  She was seen an an urgent care center 2 days ago and placed on claritin which has not improved her sx, in fact she reports feeling worse.  She has developed right ear pain without drainage today. She does endorse muffled hearing on the right.     Past Medical History  Diagnosis Date  . ADD (attention deficit disorder)   . Psychosis   . Tardive dyskinesia   . Mental disorder   . OCD (obsessive compulsive disorder)    History reviewed. No pertinent past surgical history. History reviewed. No pertinent family history. Social History  Substance Use Topics  . Smoking status: Never Smoker   . Smokeless tobacco: Never Used  . Alcohol Use: No   OB History    Gravida Para Term Preterm AB TAB SAB Ectopic Multiple Living       Review of Systems  Constitutional: Negative for fever and chills.  HENT: Positive for congestion, ear pain, rhinorrhea and sore throat. Negative for sinus pressure, trouble swallowing and voice change.   Eyes: Negative for discharge.  Respiratory: Positive for cough. Negative for shortness of breath, wheezing and stridor.   Cardiovascular: Negative for chest pain.  Gastrointestinal: Negative for abdominal pain.  Genitourinary: Negative.       Allergies   Other  Home Medications   Prior to Admission medications   Medication Sig Start Date End Date Taking? Authorizing Provider  Brexpiprazole (REXULTI) 2 MG TABS Take 2 mg by mouth daily.   Yes Historical Provider, MD  clindamycin-benzoyl peroxide (BENZACLIN) gel Apply 1 application topically 2 (two) times daily.   Yes Historical Provider, MD  cloNIDine (CATAPRES) 0.1 MG tablet Take 0.1 mg by mouth 2 (two) times daily.   Yes Historical Provider, MD  diazepam (VALIUM) 2 MG tablet Take 2 mg by mouth 3 (three) times daily.   Yes Historical Provider, MD  divalproex (DEPAKOTE) 250 MG DR tablet Take 250 mg by mouth 2 (two) times daily.   Yes Historical Provider, MD  fluticasone (FLONASE) 50 MCG/ACT nasal spray Place 2 sprays into the nose daily.   Yes Historical Provider, MD  levonorgestrel-ethinyl estradiol (NORDETTE) 0.15-30 MG-MCG tablet Take 1 tablet by mouth daily. 02/27/15  Yes Adline Potter, NP  paliperidone (INVEGA SUSTENNA) 234 MG/1.5ML SUSP injection Inject 234 mg into the muscle every 21 ( twenty-one) days.   Yes Historical Provider, MD  traZODone (DESYREL) 100 MG tablet Take 300 mg by mouth at bedtime.   Yes Historical Provider, MD  albuterol (PROVENTIL HFA;VENTOLIN HFA) 108 (90 Base) MCG/ACT inhaler Inhale 1-2 puffs into the lungs every 6 (six) hours as needed for wheezing or shortness of breath. 11/18/15   Burgess Amor, PA-C  benzonatate (TESSALON) 100 MG capsule Take 2 capsules (200 mg  total) by mouth 3 (three) times daily as needed. 11/18/15   Burgess AmorJulie Daveena Elmore, PA-C  doxycycline (VIBRAMYCIN) 100 MG capsule Take 1 capsule (100 mg total) by mouth 2 (two) times daily. 11/18/15   Burgess AmorJulie Iann Rodier, PA-C   Temp(Src) 97.6 F (36.4 C) (Oral)  LMP 10/27/2015 Physical Exam  Constitutional: She is oriented to person, place, and time. She appears well-developed and well-nourished.  HENT:  Head: Normocephalic and atraumatic.  Right Ear: Ear canal normal. Tympanic membrane is retracted. No middle ear  effusion.  Left Ear: Tympanic membrane and ear canal normal.  Nose: Rhinorrhea present. No mucosal edema.  Mouth/Throat: Uvula is midline, oropharynx is clear and moist and mucous membranes are normal. No oropharyngeal exudate, posterior oropharyngeal edema, posterior oropharyngeal erythema or tonsillar abscesses.  Eyes: Conjunctivae are normal.  Cardiovascular: Normal rate, regular rhythm and normal heart sounds.   Pulmonary/Chest: Effort normal. No respiratory distress. She has wheezes. She has no rales.  Intermittent expiratory wheeze left lower field.  Abdominal: Soft. There is no tenderness.  Musculoskeletal: Normal range of motion.  Neurological: She is alert and oriented to person, place, and time.  Skin: Skin is warm and dry. No rash noted.  Psychiatric: She has a normal mood and affect.    ED Course  Procedures (including critical care time) Labs Review Labs Reviewed - No data to display  Imaging Review Dg Chest 2 View  11/18/2015  CLINICAL DATA:  dry cough for several days.  Wheezing. EXAM: CHEST  2 VIEW COMPARISON:  None. FINDINGS: Poor definition of the left hemidiaphragm on the frontal projection is primarily ascribed to sloping of the diaphragm rather than left lower lobe airspace opacity. Indistinct left upper lobe airspace opacity anteriorly. Mild enlargement of the cardiopericardial silhouette. IMPRESSION: 1. Indistinct left upper lobe airspace opacity anteriorly, suspicious for bronchopneumonia. 2. Mild enlargement of the cardiopericardial silhouette. Electronically Signed   By: Gaylyn RongWalter  Liebkemann M.D.   On: 11/18/2015 19:34   I have personally reviewed and evaluated these images and lab results as part of my medical decision-making.   EKG Interpretation None      MDM   Final diagnoses:  Community acquired pneumonia    Pt placed on doxycycline, albuterol mdi, tessalon. Rest, f/u with pcp for a recheck this week, returning here for any immediate worsened sx. Was  going to place on zithromax, first dose given here, but not best choice given other medicines.  Pt stable for dc, no sob.     Burgess AmorJulie Ademola Vert, PA-C 11/19/15 1400  Donnetta HutchingBrian Cook, MD 11/21/15 [phone]

## 2016-02-20 ENCOUNTER — Other Ambulatory Visit: Payer: Self-pay | Admitting: Obstetrics & Gynecology

## 2016-04-01 ENCOUNTER — Other Ambulatory Visit: Payer: Self-pay | Admitting: Obstetrics & Gynecology

## 2016-11-24 IMAGING — CR DG ANKLE COMPLETE 3+V*L*
3 series · 3 of 3 positions shown · non-contrast
Comparison: None.

CLINICAL DATA: Fall, lateral ankle pain

EXAM:
LEFT ANKLE COMPLETE - 3+ VIEW

[view not recorded (1 of 3)]
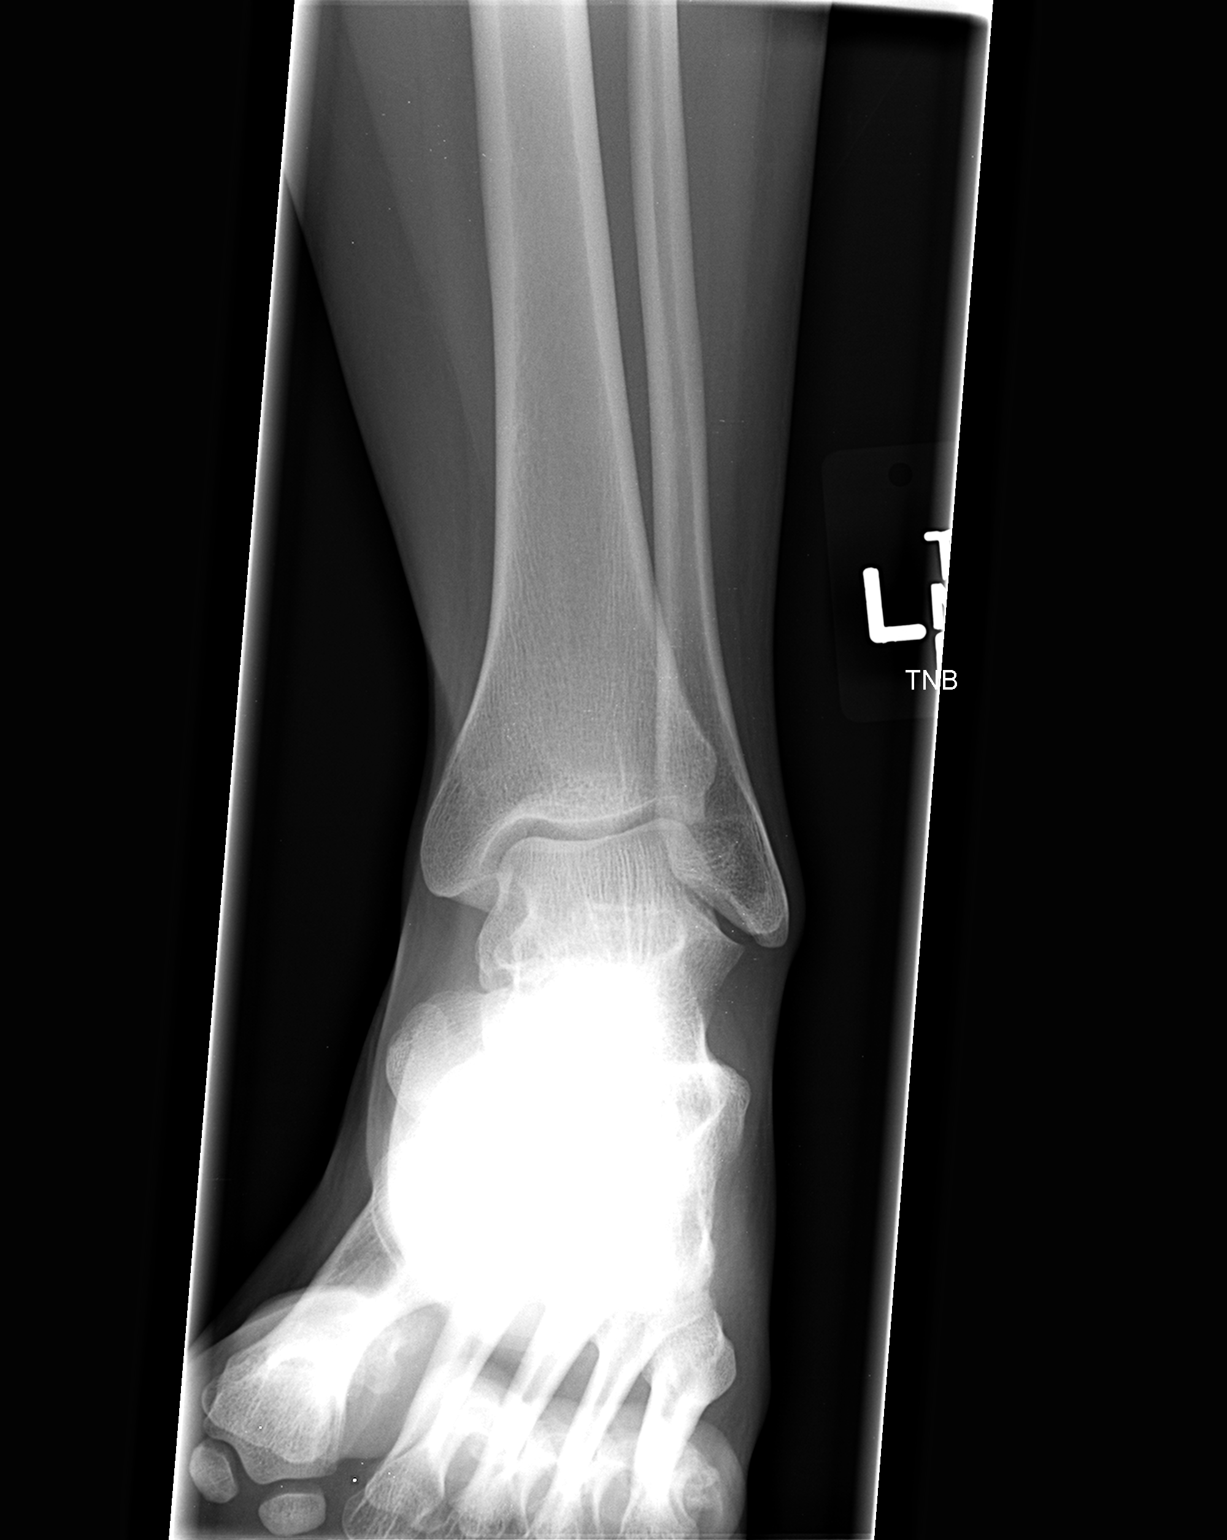

[view not recorded (2 of 3)]
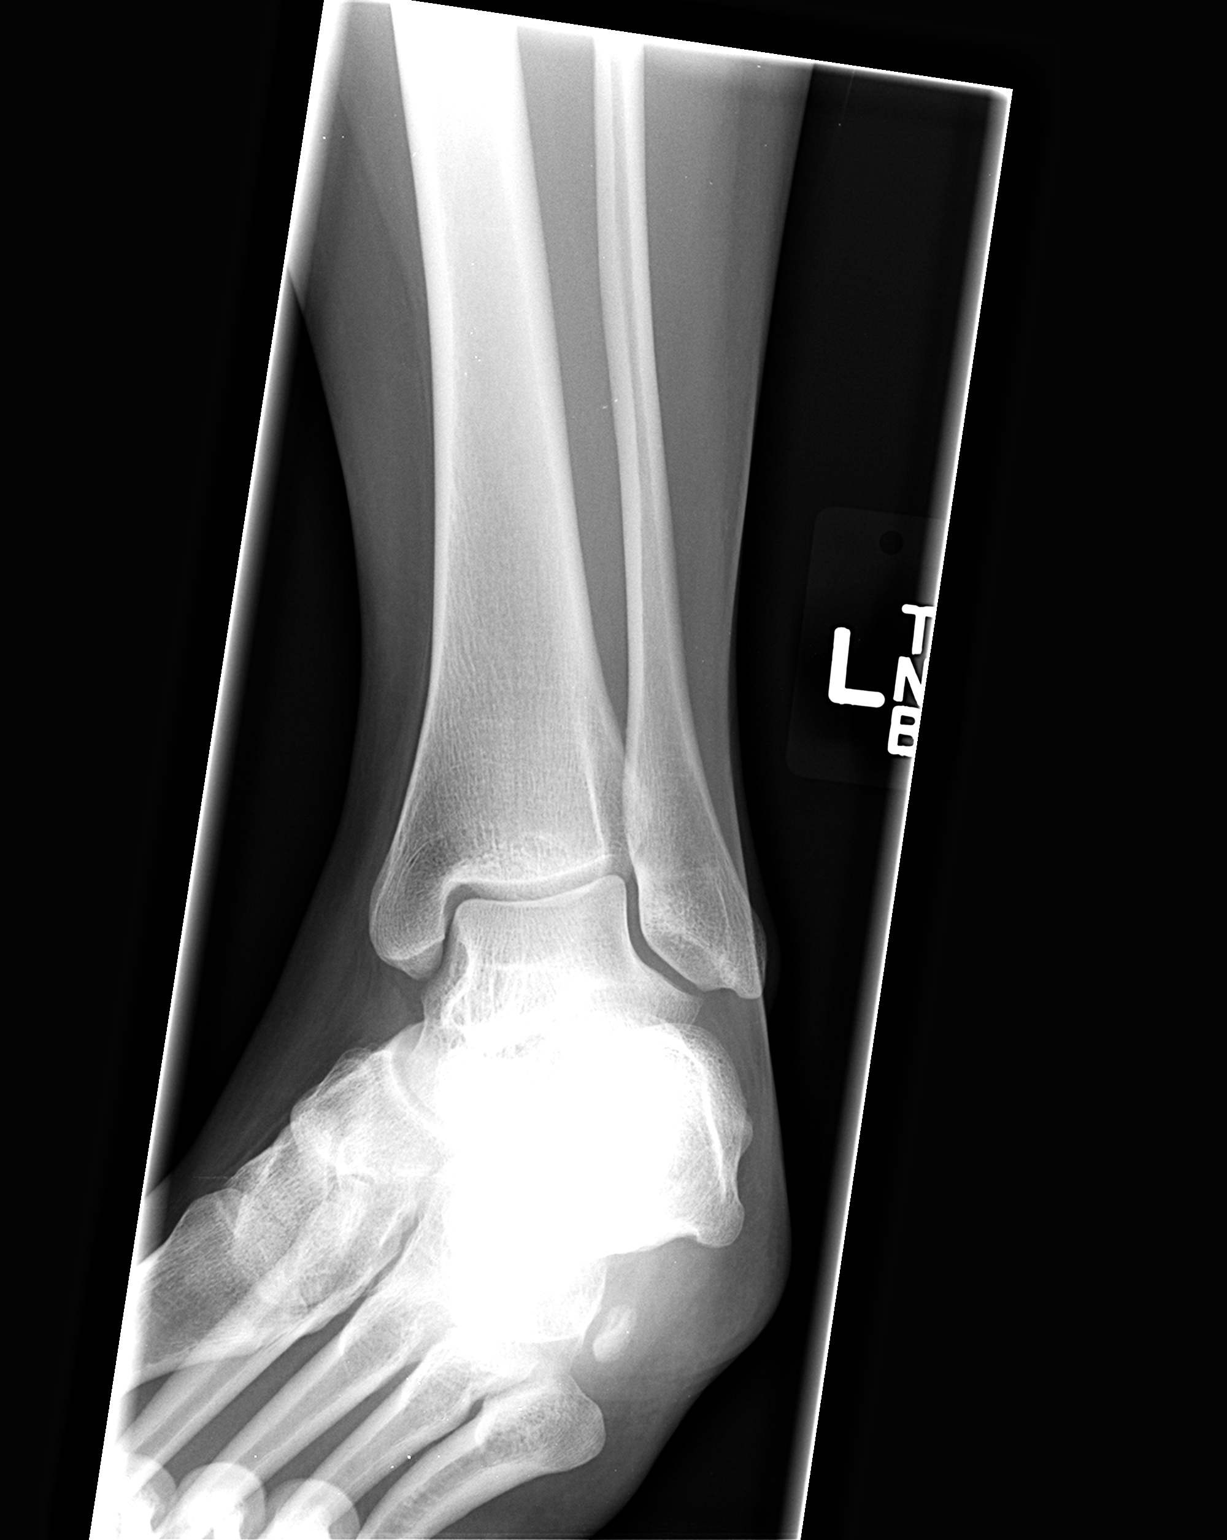

[view not recorded (3 of 3)]
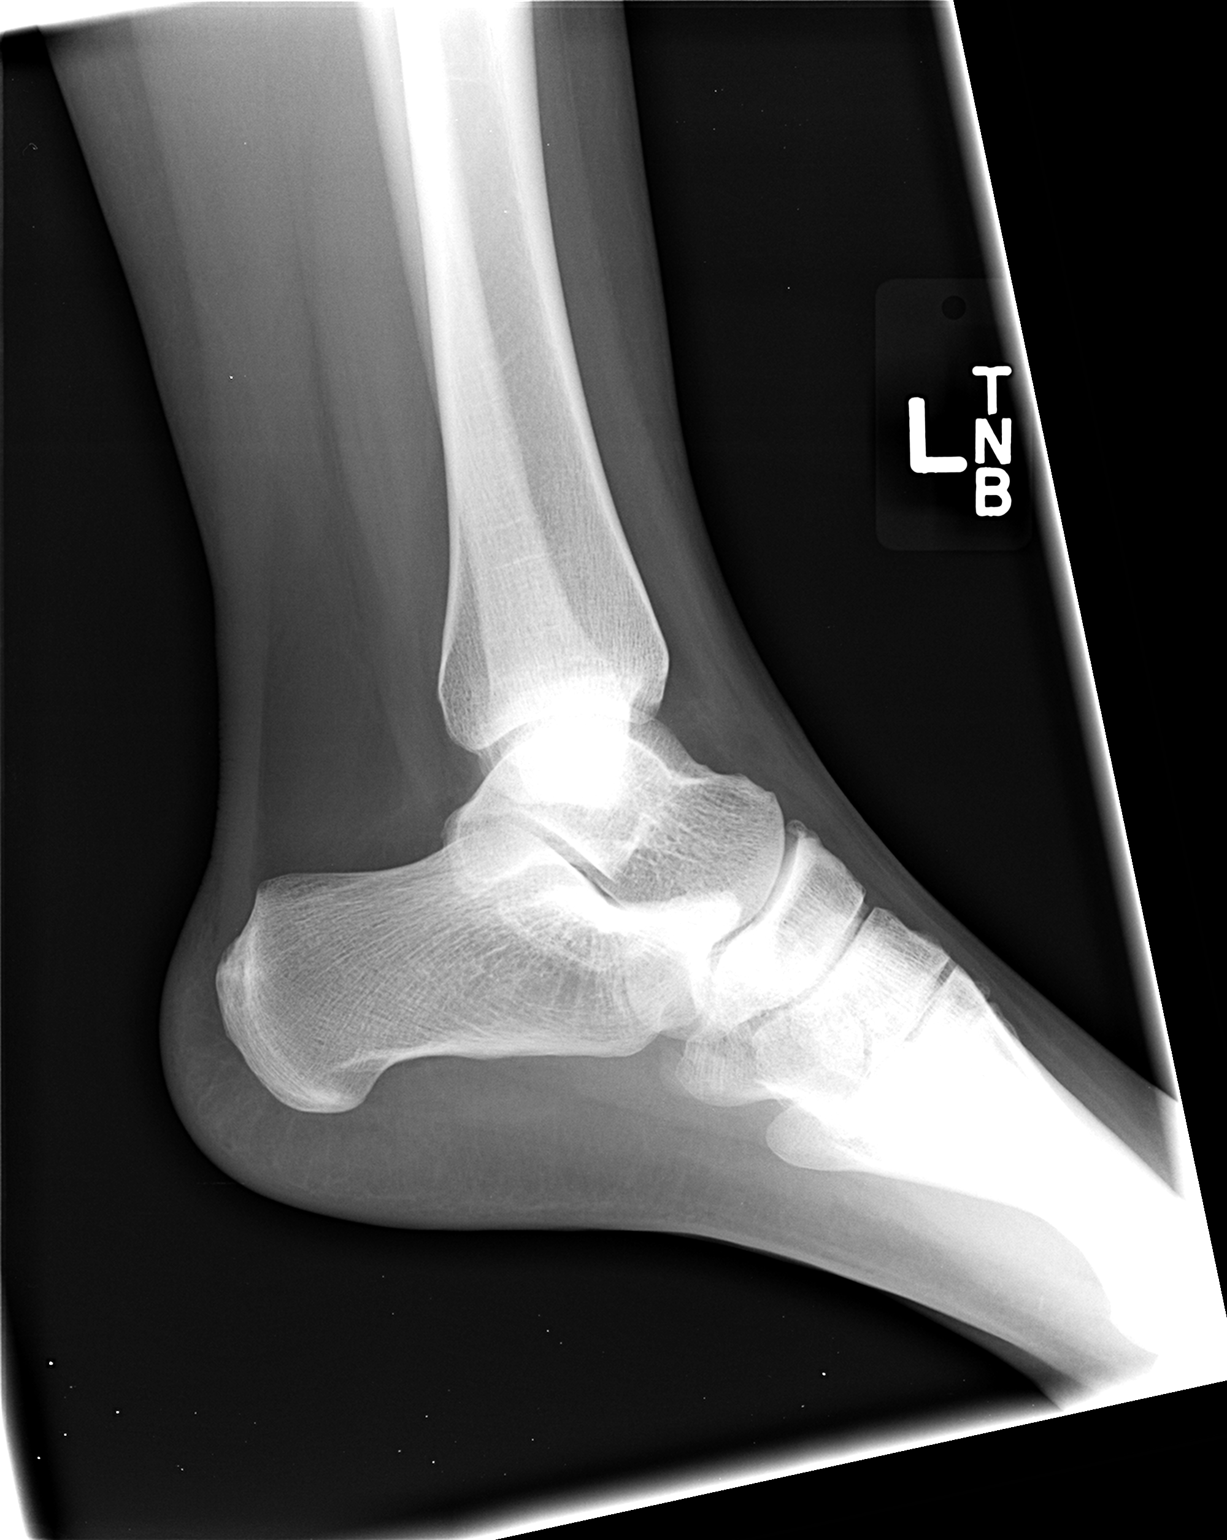

[3 of 3 positions shown; findings below may reference images not displayed]

FINDINGS: Three views of left ankle submitted. No acute fracture or
subluxation. No radiopaque foreign body. Ankle mortise is preserved.
IMPRESSION: Negative.

## 2017-03-16 ENCOUNTER — Other Ambulatory Visit (HOSPITAL_COMMUNITY)
Admission: RE | Admit: 2017-03-16 | Discharge: 2017-03-16 | Disposition: A | Discharge status: Home / Self Care | Payer: Medicaid Other | Source: Ambulatory Visit | Attending: Adult Health | Admitting: Adult Health

## 2017-03-16 ENCOUNTER — Encounter: Payer: Self-pay | Admitting: Adult Health

## 2017-03-16 ENCOUNTER — Ambulatory Visit (INDEPENDENT_AMBULATORY_CARE_PROVIDER_SITE_OTHER): Payer: Medicaid Other | Admitting: Adult Health

## 2017-03-16 VITALS — BP 112/60 | HR 82 | Ht 64.0 in | Wt 156.0 lb

## 2017-03-16 DIAGNOSIS — Z0000 Encounter for general adult medical examination without abnormal findings: Secondary | ICD-10-CM

## 2017-03-16 DIAGNOSIS — Z01419 Encounter for gynecological examination (general) (routine) without abnormal findings: Secondary | ICD-10-CM | POA: Insufficient documentation

## 2017-03-16 DIAGNOSIS — Z124 Encounter for screening for malignant neoplasm of cervix: Secondary | ICD-10-CM | POA: Insufficient documentation

## 2017-03-16 DIAGNOSIS — N946 Dysmenorrhea, unspecified: Secondary | ICD-10-CM | POA: Insufficient documentation

## 2017-03-16 MED ORDER — IBUPROFEN 800 MG PO TABS: ORAL_TABLET | ORAL | 1 refills | Status: AC

## 2017-03-16 NOTE — Progress Notes (Signed)
Patient ID: Lindsey BeachIvory L Bulson, female   DOB: 1996/03/22, 21 y.o.   MRN: [id number] History of Present Illness: Artis Flockvory is a 21 year old black female in for a well woman gyn exam and pap, and complains of periods cramps.SHe resides at [facility] [place].Care giver with her and she is cooperative.  PCP is Dr Marilu FavreQuershi and sees Dr Ave Filterhandler, [place].   Current Medications, Allergies, Past Medical History, Past Surgical History, Family History and Social History were reviewed in [facility] electronic medical record.     Review of Systems: Patient denies any headaches, hearing loss, fatigue, blurred vision, shortness of breath, chest pain, abdominal pain, problems with bowel movements, urination, or intercourse(she says she has but doe not like to). No joint pain, + mood swings. +periods cramps    Physical Exam:BP 112/60 (BP Location: Left Arm, Patient Position: Sitting, Cuff Size: Normal)   Pulse 82   Ht 5\' 4"  (1.626 m)   Wt 156 lb (70.8 kg)   LMP 03/16/2017 (Exact Date)   BMI 26.78 kg/m  General:  Well developed, well nourished, no acute distress Skin:  Warm and dry Neck:  Midline trachea, normal thyroid, good ROM, no lymphadenopathy Lungs; Clear to auscultation bilaterally Breast:  No dominant palpable mass, retraction, or nipple discharge Cardiovascular: Regular rate and rhythm Abdomen:  Soft, non tender, no hepatosplenomegaly Pelvic:  External genitalia is normal in appearance, no lesions.  The vagina is normal in appearance,+period like blood. Urethra has no lesions or masses. The cervix is smooth, pap with GC/CHL performed.  Uterus is felt to be normal size, shape, and contour. Uterus tender today. No adnexal masses or tenderness noted.Bladder is non tender, no masses felt. Extremities/musculoskeletal:  No swelling or varicosities noted, no clubbing or cyanosis Psych: Alert and cooperative,seems happy,can be moody PHQ 2 score 0.   Impression: 1. Encounter for  routine gynecological examination with Papanicolaou smear of cervix   2. Routine cervical smear   3. Menstrual cramps       Plan: Rx motrin 800 mg #60 take 1 every 8 hours prn cramps with 1 refill Pap and physical in 1 year

## 2017-03-16 NOTE — Addendum Note (Signed)
Addended by: Colen DarlingYOUNG, Azariah Bonura S on: 03/16/2017 03:04 PM   Modules accepted: Orders

## 2017-03-16 NOTE — Patient Instructions (Signed)
Pap and physical in 2 years Try motrin 800 mg 1 every 8 hours prn  for cramps

## 2017-03-18 LAB — CYTOLOGY - PAP
ADEQUACY: ABSENT
CHLAMYDIA, DNA PROBE: NEGATIVE
DIAGNOSIS: NEGATIVE
Neisseria Gonorrhea: NEGATIVE

## 2017-04-01 ENCOUNTER — Other Ambulatory Visit: Payer: Self-pay | Admitting: Obstetrics & Gynecology

## 2017-04-13 ENCOUNTER — Encounter: Payer: Self-pay | Admitting: Physician Assistant

## 2017-04-13 ENCOUNTER — Ambulatory Visit: Payer: Medicaid Other | Admitting: Physician Assistant

## 2017-04-13 VITALS — BP 96/62 | HR 70 | Temp 97.5°F | Wt 153.4 lb

## 2017-04-13 DIAGNOSIS — F79 Unspecified intellectual disabilities: Secondary | ICD-10-CM

## 2017-04-13 DIAGNOSIS — M79672 Pain in left foot: Secondary | ICD-10-CM

## 2017-04-13 NOTE — Progress Notes (Signed)
LMP 03/16/2017 (Exact Date)    Subjective:    Patient ID: Lindsey Rivers, female    DOB: 1995-09-21, 21 y.o.   MRN: [id number]  HPI: Lindsey Rivers is a 21 y.o. female presenting on 04/13/2017 for No chief complaint on file.   HPI   I am seeing pt at [facility] clinic as interim provider until new permanent provider takes over mid-October  Pt lives in [facility] and is here with her caregiver.  Pt here for PAP but she had one last month  Pt hurt L foot & toe last night. She got hit with basketball.  She was wearing her sneakers.   Labs 01/05/17 normal  Relevant past medical, surgical, family and social history reviewed and updated as indicated. Interim medical history since our last visit reviewed. Allergies and medications reviewed and updated.   Current Outpatient Prescriptions:  .  Cariprazine HCl (VRAYLAR) 4.5 MG CAPS, Take by mouth daily., Disp: , Rfl:  .  clindamycin-benzoyl peroxide (BENZACLIN) gel, Apply 1 application topically 2 (two) times daily., Disp: , Rfl:  .  cloNIDine (CATAPRES) 0.1 MG tablet, Take 0.1 mg by mouth 3 (three) times daily. , Disp: , Rfl:  .  diazepam (VALIUM) 2 MG tablet, Take 2 mg by mouth 3 (three) times daily., Disp: , Rfl:  .  divalproex (DEPAKOTE) 250 MG DR tablet, Take 250 mg by mouth 2 (two) times daily., Disp: , Rfl:  .  fluticasone (FLONASE) 50 MCG/ACT nasal spray, Place 1 spray into both nostrils daily. , Disp: , Rfl:  .  KETOCONAZOLE, TOPICAL, (NIZORAL A-D) 1 % SHAM, Apply topically 3 (three) times a week., Disp: , Rfl:  .  levonorgestrel-ethinyl estradiol (NORDETTE) 0.15-30 MG-MCG tablet, TAKE (1) TABLET BY MOUTH ONCE DAILY., Disp: 28 tablet, Rfl: 11 .  paliperidone (INVEGA SUSTENNA) 234 MG/1.5ML SUSP injection, Inject 234 mg into the muscle every 21 ( twenty-one) days., Disp: , Rfl:  .  traZODone (DESYREL) 100 MG tablet, Take 300 mg by mouth at bedtime., Disp: , Rfl:  .  albuterol (PROVENTIL HFA;VENTOLIN HFA) 108 (90 Base)  MCG/ACT inhaler, Inhale 1-2 puffs into the lungs every 6 (six) hours as needed for wheezing or shortness of breath. (Patient not taking: Reported on 04/13/2017), Disp: 1 Inhaler, Rfl: 0 .  benzonatate (TESSALON) 100 MG capsule, Take 2 capsules (200 mg total) by mouth 3 (three) times daily as needed. (Patient not taking: Reported on 04/13/2017), Disp: 30 capsule, Rfl: 0 .  Brexpiprazole (REXULTI) 2 MG TABS, Take 2 mg by mouth daily., Disp: , Rfl:  .  ibuprofen (ADVIL,MOTRIN) 800 MG tablet, Take 1 every 8 hours prn cramps (Patient not taking: Reported on 04/13/2017), Disp: 60 tablet, Rfl: 1   Review of Systems  Constitutional: Negative for appetite change, chills, diaphoresis, fatigue, fever and unexpected weight change.  HENT: Positive for sneezing. Negative for congestion, dental problem, drooling, ear pain, facial swelling, hearing loss, mouth sores, sore throat, trouble swallowing and voice change.   Eyes: Negative for pain, discharge, redness, itching and visual disturbance.  Respiratory: Positive for wheezing. Negative for cough, choking and shortness of breath.   Cardiovascular: Negative for chest pain, palpitations and leg swelling.  Gastrointestinal: Negative for abdominal pain, blood in stool, constipation, diarrhea and vomiting.  Endocrine: Negative for cold intolerance, heat intolerance and polydipsia.  Genitourinary: Negative for decreased urine volume, dysuria and hematuria.  Musculoskeletal: Negative for arthralgias, back pain and gait problem.  Skin: Negative for rash.  Allergic/Immunologic: Positive for  environmental allergies.  Neurological: Negative for seizures, syncope, light-headedness and headaches.  Hematological: Negative for adenopathy.  Psychiatric/Behavioral: Negative for agitation, dysphoric mood and suicidal ideas. The patient is not nervous/anxious.     Per HPI unless specifically indicated above     Objective:    LMP 03/16/2017 (Exact Date)   Wt Readings from  Last 3 Encounters:  03/16/17 156 lb (70.8 kg)  02/27/15 150 lb (68 kg)  09/28/13 136 lb (61.7 kg) (64 %, Z= 0.36)*   * Growth percentiles are based on CDC 2-20 Years data.   Blood pressure 96/62, pulse 70, temperature (!) 97.5 F (36.4 C), weight 153 lb 6.4 oz (69.6 kg), last menstrual period 03/16/2017, SpO2 99 %.     Physical Exam  Constitutional: She is oriented to person, place, and time. She appears well-developed and well-nourished.  HENT:  Head: Normocephalic and atraumatic.  Neck: Neck supple.  Cardiovascular: Normal rate and regular rhythm.   Pulmonary/Chest: Effort normal and breath sounds normal.  Abdominal: Soft. Bowel sounds are normal. She exhibits no mass. There is no hepatosplenomegaly. There is no tenderness.  Musculoskeletal: She exhibits no edema.       Left ankle: Normal.       Left foot: Normal. There is normal range of motion, no tenderness, no bony tenderness and no swelling.  Lymphadenopathy:    She has no cervical adenopathy.  Neurological: She is alert and oriented to person, place, and time.  Skin: Skin is warm and dry.  Psychiatric: She has a normal mood and affect. Her behavior is normal.  Vitals reviewed.       Assessment & Plan:    Encounter Diagnoses  Name Primary?  . Left foot pain Yes  . Intellectual disability     -reassured pt that her foot appears to just be mildly bruised.  She can use some ice on it if it is hurting. -no changes in medications today -pt to follow up in 6 months.  RTO sooner prn

## 2018-03-06 ENCOUNTER — Other Ambulatory Visit: Payer: Self-pay | Admitting: Obstetrics & Gynecology

## 2019-02-05 ENCOUNTER — Other Ambulatory Visit: Payer: Self-pay | Admitting: Obstetrics & Gynecology

## 2020-01-04 ENCOUNTER — Other Ambulatory Visit: Payer: Self-pay | Admitting: Obstetrics & Gynecology

## 2021-02-23 ENCOUNTER — Other Ambulatory Visit: Payer: Self-pay | Admitting: Obstetrics & Gynecology
